# Patient Record
Sex: Male | Born: 1955 | Hispanic: No | Marital: Married | Smoking: Never smoker
Health system: Southern US, Community
[De-identification: ages and names within clinical notes are randomized; demographics above are authoritative.]

---

## 2009-11-21 ENCOUNTER — Encounter: Admission: RE | Admit: 2009-11-21 | Discharge: 2009-11-21 | Payer: Self-pay | Admitting: Specialist

## 2016-01-13 DIAGNOSIS — Y92410 Unspecified street and highway as the place of occurrence of the external cause: Secondary | ICD-10-CM | POA: Insufficient documentation

## 2016-01-13 DIAGNOSIS — S199XXA Unspecified injury of neck, initial encounter: Secondary | ICD-10-CM | POA: Diagnosis not present

## 2016-01-13 DIAGNOSIS — S29001A Unspecified injury of muscle and tendon of front wall of thorax, initial encounter: Secondary | ICD-10-CM | POA: Diagnosis not present

## 2016-01-13 DIAGNOSIS — Z299 Encounter for prophylactic measures, unspecified: Secondary | ICD-10-CM | POA: Insufficient documentation

## 2016-01-13 DIAGNOSIS — Y9389 Activity, other specified: Secondary | ICD-10-CM | POA: Diagnosis not present

## 2016-01-13 DIAGNOSIS — Y998 Other external cause status: Secondary | ICD-10-CM | POA: Insufficient documentation

## 2016-01-14 ENCOUNTER — Encounter (HOSPITAL_COMMUNITY): Payer: Self-pay | Admitting: *Deleted

## 2016-01-14 ENCOUNTER — Emergency Department (HOSPITAL_COMMUNITY)
Admission: EM | Admit: 2016-01-14 | Discharge: 2016-01-14 | Disposition: A | Discharge status: Home / Self Care | Payer: No Typology Code available for payment source | Attending: Emergency Medicine | Admitting: Emergency Medicine

## 2016-01-14 ENCOUNTER — Emergency Department (HOSPITAL_COMMUNITY): Payer: No Typology Code available for payment source

## 2016-01-14 DIAGNOSIS — M542 Cervicalgia: Secondary | ICD-10-CM

## 2016-01-14 DIAGNOSIS — R079 Chest pain, unspecified: Secondary | ICD-10-CM

## 2016-01-14 MED ORDER — OXYCODONE-ACETAMINOPHEN 5-325 MG PO TABS: 1.0000 | ORAL_TABLET | ORAL | Status: AC | PRN

## 2016-01-14 MED ORDER — CYCLOBENZAPRINE HCL 10 MG PO TABS: 10.0000 mg | ORAL_TABLET | Freq: Three times a day (TID) | ORAL | Status: AC | PRN

## 2016-01-14 MED ORDER — IBUPROFEN 200 MG PO TABS
600.0000 mg | ORAL_TABLET | Freq: Once | ORAL | Status: AC
  Administered 2016-01-14: 600 mg via ORAL
  Filled 2016-01-14: qty 1

## 2016-01-14 MED ORDER — IBUPROFEN 600 MG PO TABS: 600.0000 mg | ORAL_TABLET | Freq: Three times a day (TID) | ORAL | Status: AC | PRN

## 2016-01-14 MED ORDER — OXYCODONE-ACETAMINOPHEN 5-325 MG PO TABS
2.0000 | ORAL_TABLET | Freq: Once | ORAL | Status: AC
  Administered 2016-01-14: 2 via ORAL
  Filled 2016-01-14: qty 2
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
